# Patient Record
Sex: Male | Born: 1955 | Marital: Married
Health system: Southern US, Community
[De-identification: ages and names within clinical notes are randomized; demographics above are authoritative.]

---

## 2014-08-11 ENCOUNTER — Emergency Department: Payer: Self-pay | Admitting: Emergency Medicine
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
## 2019-09-28 ENCOUNTER — Ambulatory Visit: Payer: Self-pay | Attending: Internal Medicine

## 2019-09-28 DIAGNOSIS — Z23 Encounter for immunization: Secondary | ICD-10-CM

## 2019-09-28 NOTE — Progress Notes (Signed)
   Covid-19 Vaccination Clinic  Name:  Drew Richardson    MRN: [id number] DOB: Mar 16, 1956  09/28/2019  Drew Richardson was observed post Covid-19 immunization for 15 minutes without incident. He was provided with Vaccine Information Sheet and instruction to access the V-Safe system.   Drew Richardson was instructed to call 911 with any severe reactions post vaccine: Marland Kitchen Difficulty breathing  . Swelling of face and throat  . A fast heartbeat  . A bad rash all over body  . Dizziness and weakness   Immunizations Administered    Name Date Dose VIS Date Route   Pfizer COVID-19 Vaccine 09/28/2019  3:25 PM 0.3 mL 06/08/2019 Intramuscular   Manufacturer: [employer], [employer]   Lot: [id number]   NDC: [address]-1

## 2019-10-20 ENCOUNTER — Ambulatory Visit: Payer: Self-pay | Attending: Internal Medicine

## 2019-10-20 DIAGNOSIS — Z23 Encounter for immunization: Secondary | ICD-10-CM

## 2019-10-20 NOTE — Progress Notes (Signed)
   Covid-19 Vaccination Clinic  Name:  Drew Richardson    MRN: [id number] DOB: 01-17-1956  10/20/2019  Drew Richardson was observed post Covid-19 immunization for 15 minutes without incident. He was provided with Vaccine Information Sheet and instruction to access the V-Safe system.   Drew Richardson was instructed to call 911 with any severe reactions post vaccine: Marland Kitchen Difficulty breathing  . Swelling of face and throat  . A fast heartbeat  . A bad rash all over body  . Dizziness and weakness   Immunizations Administered    Name Date Dose VIS Date Route   Pfizer COVID-19 Vaccine 10/20/2019  3:13 PM 0.3 mL 08/22/2018 Intramuscular   Manufacturer: [employer], [employer]   Lot: [id number]   NDC: [address]-3

## 2023-10-13 DIAGNOSIS — R030 Elevated blood-pressure reading, without diagnosis of hypertension: Secondary | ICD-10-CM | POA: Diagnosis not present

## 2023-10-13 DIAGNOSIS — Z13228 Encounter for screening for other metabolic disorders: Secondary | ICD-10-CM | POA: Diagnosis not present

## 2023-10-13 DIAGNOSIS — Z125 Encounter for screening for malignant neoplasm of prostate: Secondary | ICD-10-CM | POA: Diagnosis not present

## 2023-10-13 DIAGNOSIS — Z130 Encounter for screening for diseases of the blood and blood-forming organs and certain disorders involving the immune mechanism: Secondary | ICD-10-CM | POA: Diagnosis not present

## 2023-10-13 DIAGNOSIS — E785 Hyperlipidemia, unspecified: Secondary | ICD-10-CM | POA: Diagnosis not present

## 2023-10-13 DIAGNOSIS — R7303 Prediabetes: Secondary | ICD-10-CM | POA: Diagnosis not present

## 2023-10-13 DIAGNOSIS — M5432 Sciatica, left side: Secondary | ICD-10-CM | POA: Diagnosis not present

## 2023-10-13 DIAGNOSIS — M5431 Sciatica, right side: Secondary | ICD-10-CM | POA: Diagnosis not present

## 2023-10-13 DIAGNOSIS — Z1329 Encounter for screening for other suspected endocrine disorder: Secondary | ICD-10-CM | POA: Diagnosis not present

## 2024-01-02 DIAGNOSIS — Z131 Encounter for screening for diabetes mellitus: Secondary | ICD-10-CM | POA: Diagnosis not present

## 2024-01-02 DIAGNOSIS — R030 Elevated blood-pressure reading, without diagnosis of hypertension: Secondary | ICD-10-CM | POA: Diagnosis not present

## 2024-01-02 DIAGNOSIS — E785 Hyperlipidemia, unspecified: Secondary | ICD-10-CM | POA: Diagnosis not present

## 2024-01-02 DIAGNOSIS — Z1329 Encounter for screening for other suspected endocrine disorder: Secondary | ICD-10-CM | POA: Diagnosis not present

## 2024-01-02 DIAGNOSIS — Z130 Encounter for screening for diseases of the blood and blood-forming organs and certain disorders involving the immune mechanism: Secondary | ICD-10-CM | POA: Diagnosis not present

## 2024-01-02 DIAGNOSIS — Z125 Encounter for screening for malignant neoplasm of prostate: Secondary | ICD-10-CM | POA: Diagnosis not present

## 2024-01-02 DIAGNOSIS — Z13228 Encounter for screening for other metabolic disorders: Secondary | ICD-10-CM | POA: Diagnosis not present

## 2024-01-02 DIAGNOSIS — Z8042 Family history of malignant neoplasm of prostate: Secondary | ICD-10-CM | POA: Diagnosis not present

## 2024-01-02 DIAGNOSIS — R7303 Prediabetes: Secondary | ICD-10-CM | POA: Diagnosis not present

## 2024-01-31 DIAGNOSIS — Z1211 Encounter for screening for malignant neoplasm of colon: Secondary | ICD-10-CM | POA: Diagnosis not present

## 2024-01-31 DIAGNOSIS — Z7185 Encounter for immunization safety counseling: Secondary | ICD-10-CM | POA: Diagnosis not present

## 2024-01-31 DIAGNOSIS — Z23 Encounter for immunization: Secondary | ICD-10-CM | POA: Diagnosis not present

## 2024-01-31 DIAGNOSIS — Z0000 Encounter for general adult medical examination without abnormal findings: Secondary | ICD-10-CM | POA: Diagnosis not present

## 2024-02-09 DIAGNOSIS — Z1211 Encounter for screening for malignant neoplasm of colon: Secondary | ICD-10-CM | POA: Diagnosis not present
